# Patient Record
Sex: Male | Born: 1965 | Marital: Single | Smoking: Former smoker
Health system: Southern US, Community
[De-identification: ages and names within clinical notes are randomized; demographics above are authoritative.]

## PROBLEM LIST (undated history)

## (undated) DIAGNOSIS — T7840XA Allergy, unspecified, initial encounter: Secondary | ICD-10-CM

## (undated) DIAGNOSIS — J45909 Unspecified asthma, uncomplicated: Secondary | ICD-10-CM

## (undated) HISTORY — DX: Unspecified asthma, uncomplicated: J45.909

## (undated) HISTORY — DX: Allergy, unspecified, initial encounter: T78.40XA

---

## 2012-12-01 ENCOUNTER — Ambulatory Visit (INDEPENDENT_AMBULATORY_CARE_PROVIDER_SITE_OTHER): Payer: BC Managed Care – PPO | Admitting: Internal Medicine

## 2012-12-01 ENCOUNTER — Ambulatory Visit: Payer: BC Managed Care – PPO

## 2012-12-01 VITALS — BP 126/84 | HR 71 | Temp 97.9°F | Resp 16 | Ht 71.25 in | Wt 175.6 lb

## 2012-12-01 DIAGNOSIS — R059 Cough, unspecified: Secondary | ICD-10-CM

## 2012-12-01 DIAGNOSIS — R062 Wheezing: Secondary | ICD-10-CM

## 2012-12-01 DIAGNOSIS — R05 Cough: Secondary | ICD-10-CM

## 2012-12-01 MED ORDER — PREDNISONE 20 MG PO TABS: ORAL_TABLET | ORAL | Status: DC

## 2012-12-01 MED ORDER — LEVALBUTEROL HCL 0.63 MG/3ML IN NEBU
0.6300 mg | INHALATION_SOLUTION | Freq: Once | RESPIRATORY_TRACT | Status: AC
  Administered 2012-12-01: 0.63 mg via RESPIRATORY_TRACT

## 2012-12-01 MED ORDER — AZITHROMYCIN 250 MG PO TABS: ORAL_TABLET | ORAL | Status: DC

## 2012-12-01 NOTE — Progress Notes (Signed)
  Subjective:    Patient ID: Carl Mcmahon, male    DOB: 22-Aug-1966, 47 y.o.   MRN: [id number]  HPI 47 year old male presents with 1 week history of cough, nasal congestion, clear rhinorrhea, wheezing, and chest tightness.  He does have a history of asthma treated with daily Qvar and Advair and albuterol prn.  Says it is well controlled except will flare with illnesses and also during season changes.  He has recently moved here from [place] - states his allergies and asthma were both worse there.  Denies fever, chills, nausea, vomiting, headache, otalgia, sinus pain, or sore throat.  He is otherwise healthy with no other concerns today.      Review of Systems  Constitutional: Negative for fever and chills.  HENT: Positive for congestion and rhinorrhea. Negative for ear pain, sore throat, neck pain, neck stiffness and postnasal drip.   Respiratory: Positive for cough, shortness of breath and wheezing.   Cardiovascular: Negative for chest pain.  Gastrointestinal: Negative for nausea.  Neurological: Negative for dizziness and headaches.  All other systems reviewed and are negative.       Objective:   Physical Exam  Constitutional: He is oriented to person, place, and time. He appears well-developed and well-nourished.  HENT:  Head: Normocephalic and atraumatic.  Right Ear: Hearing, tympanic membrane, external ear and ear canal normal.  Left Ear: Hearing, tympanic membrane, external ear and ear canal normal.  Mouth/Throat: Uvula is midline, oropharynx is clear and moist and mucous membranes are normal. No oropharyngeal exudate.  Eyes: Conjunctivae are normal.  Neck: Normal range of motion. Neck supple.  Cardiovascular: Normal rate, regular rhythm and normal heart sounds.   Pulmonary/Chest: Effort normal. He has wheezes.  Lymphadenopathy:    He has no cervical adenopathy.  Neurological: He is alert and oriented to person, place, and time.  Psychiatric: He has a normal mood and affect. His  behavior is normal. Judgment and thought content normal.      [facility] reading (PRIMARY) by  Dr. Merla Riches as no acute infiltrate or consolidation noted.      Assessment & Plan:   1. Cough  DG Chest 2 View   DG Chest 2 View  2. Wheezing  levalbuterol (XOPENEX) nebulizer solution 0.63 mg   levalbuterol (XOPENEX) nebulizer solution 0.63 mg   Will go ahead and cover with Zpack Prednisone taper Recommend Ventolin q4-6hours prn wheezing and SOB Follow up if symptoms worsen or fail to improve.   I have reviewed and agree with documentation. Robert P. Merla Riches, M.D.

## 2013-02-27 ENCOUNTER — Ambulatory Visit (INDEPENDENT_AMBULATORY_CARE_PROVIDER_SITE_OTHER): Payer: BC Managed Care – PPO | Admitting: Family Medicine

## 2013-02-27 ENCOUNTER — Encounter: Payer: Self-pay | Admitting: Family Medicine

## 2013-02-27 VITALS — BP 118/82 | HR 80 | Temp 98.4°F | Resp 16 | Ht 70.0 in | Wt 177.2 lb

## 2013-02-27 DIAGNOSIS — J309 Allergic rhinitis, unspecified: Secondary | ICD-10-CM

## 2013-02-27 DIAGNOSIS — J45909 Unspecified asthma, uncomplicated: Secondary | ICD-10-CM | POA: Insufficient documentation

## 2013-02-27 MED ORDER — MOMETASONE FURO-FORMOTEROL FUM 200-5 MCG/ACT IN AERO: 2.0000 | INHALATION_SPRAY | Freq: Two times a day (BID) | RESPIRATORY_TRACT | Status: DC

## 2013-02-27 MED ORDER — ALBUTEROL SULFATE HFA 108 (90 BASE) MCG/ACT IN AERS: 2.0000 | INHALATION_SPRAY | Freq: Four times a day (QID) | RESPIRATORY_TRACT | Status: DC | PRN

## 2013-02-27 MED ORDER — TIOTROPIUM BROMIDE MONOHYDRATE 18 MCG IN CAPS: 18.0000 ug | ORAL_CAPSULE | Freq: Every day | RESPIRATORY_TRACT | Status: AC

## 2013-02-27 MED ORDER — MONTELUKAST SODIUM 10 MG PO TABS: 10.0000 mg | ORAL_TABLET | Freq: Every day | ORAL | Status: AC

## 2013-02-27 NOTE — Patient Instructions (Signed)
Asthma, Adult Asthma is caused by narrowing of the air passages in the lungs. It may be triggered by pollen, dust, animal dander, molds, some foods, respiratory infections, exposure to smoke, exercise, emotional stress or other allergens (things that cause allergic reactions or allergies). Repeat attacks are common. HOME CARE INSTRUCTIONS   Use prescription medications as ordered by your caregiver.  Avoid pollen, dust, animal dander, molds, smoke and other things that cause attacks at home and at work.  You may have fewer attacks if you decrease dust in your home. Electrostatic air cleaners may help.  It may help to replace your pillows or mattress with materials less likely to cause allergies.  Talk to your caregiver about an action plan for managing asthma attacks at home, including, the use of a peak flow meter which measures the severity of your asthma attack. An action plan can help minimize or stop the attack without having to seek medical care.  If you are not on a fluid restriction, drink 8 to 10 glasses of water each day.  Always have a plan prepared for seeking medical attention, including, calling your physician, accessing local emergency care, and calling 911 (in the [place] for a severe attack.  Discuss possible exercise routines with your caregiver.  If animal dander is the cause of asthma, you may need to get rid of pets. SEEK MEDICAL CARE IF:   You have wheezing and shortness of breath even if taking medicine to prevent attacks.  You have muscle aches, chest pain or thickening of sputum.  Your sputum changes from clear or white to yellow, green, gray, or bloody.  You have any problems that may be related to the medicine you are taking (such as a rash, itching, swelling or trouble breathing). SEEK IMMEDIATE MEDICAL CARE IF:   Your usual medicines do not stop your wheezing or there is increased coughing and/or shortness of breath.  You have increased difficulty  breathing.  You have a fever. MAKE SURE YOU:   Understand these instructions.  Will watch your condition.  Will get help right away if you are not doing well or get worse. Document Released: 10/09/2005 Document Revised: 01/01/2012 Document Reviewed: 05/27/2008 [facility] Patient Information 2013 [place], [place].   Asthma Attack Prevention HOW CAN ASTHMA BE PREVENTED? Currently, there is no way to prevent asthma from starting. However, you can take steps to control the disease and prevent its symptoms after you have been diagnosed. Learn about your asthma and how to control it. Take an active role to control your asthma by working with your caregiver to create and follow an asthma action plan. An asthma action plan guides you in taking your medicines properly, avoiding factors that make your asthma worse, tracking your level of asthma control, responding to worsening asthma, and seeking emergency care when needed. To track your asthma, keep records of your symptoms, check your peak flow number using a peak flow meter (handheld device that shows how well air moves out of your lungs), and get regular asthma checkups.  Other ways to prevent asthma attacks include:  Use medicines as your caregiver directs.  Identify and avoid things that make your asthma worse (as much as you can).  Keep track of your asthma symptoms and level of control.  Get regular checkups for your asthma.  With your caregiver, write a detailed plan for taking medicines and managing an asthma attack. Then be sure to follow your action plan. Asthma is an ongoing condition that needs regular monitoring  and treatment.  Identify and avoid asthma triggers. A number of outdoor allergens and irritants (pollen, mold, cold air, air pollution) can trigger asthma attacks. Find out what causes or makes your asthma worse, and take steps to avoid those triggers (see below).  Monitor your breathing. Learn to recognize warning signs of  an attack, such as slight coughing, wheezing or shortness of breath. However, your lung function may already decrease before you notice any signs or symptoms, so regularly measure and record your peak airflow with a home peak flow meter.  Identify and treat attacks early. If you act quickly, you're less likely to have a severe attack. You will also need less medicine to control your symptoms. When your peak flow measurements decrease and alert you to an upcoming attack, take your medicine as instructed, and immediately stop any activity that may have triggered the attack. If your symptoms do not improve, get medical help.  Pay attention to increasing quick-relief inhaler use. If you find yourself relying on your quick-relief inhaler (such as albuterol), your asthma is not under control. See your caregiver about adjusting your treatment. IDENTIFY AND CONTROL FACTORS THAT MAKE YOUR ASTHMA WORSE A number of common things can set off or make your asthma symptoms worse (asthma triggers). Keep track of your asthma symptoms for several weeks, detailing all the environmental and emotional factors that are linked with your asthma. When you have an asthma attack, go back to your asthma diary to see which factor, or combination of factors, might have contributed to it. Once you know what these factors are, you can take steps to control many of them.  Allergies: If you have allergies and asthma, it is important to take asthma prevention steps at home. Asthma attacks (worsening of asthma symptoms) can be triggered by allergies, which can cause temporary increased inflammation of your airways. Minimizing contact with the substance to which you are allergic will help prevent an asthma attack. Animal Dander:   Some people are allergic to the flakes of skin or dried saliva from animals with fur or feathers. Keep these pets out of your home.  If you can't keep a pet outdoors, keep the pet out of your bedroom and other  sleeping areas at all times, and keep the door closed.  Remove carpets and furniture covered with cloth from your home. If that is not possible, keep the pet away from fabric-covered furniture and carpets. Dust Mites:  Many people with asthma are allergic to dust mites. Dust mites are tiny bugs that are found in every home, in mattresses, pillows, carpets, fabric-covered furniture, bedcovers, clothes, stuffed toys, fabric, and other fabric-covered items.  Cover your mattress in a special dust-proof cover.  Cover your pillow in a special dust-proof cover, or wash the pillow each week in hot water. Water must be hotter than 130 F to kill dust mites. Cold or warm water used with detergent and bleach can also be effective.  Wash the sheets and blankets on your bed each week in hot water.  Try not to sleep or lie on cloth-covered cushions.  Call ahead when traveling and ask for a smoke-free hotel room. Bring your own bedding and pillows, in case the hotel only supplies feather pillows and down comforters, which may contain dust mites and cause asthma symptoms.  Remove carpets from your bedroom and those laid on concrete, if you can.  Keep stuffed toys out of the bed, or wash the toys weekly in hot water or cooler water  with detergent and bleach. Cockroaches:  Many people with asthma are allergic to the droppings and remains of cockroaches.  Keep food and garbage in closed containers. Never leave food out.  Use poison baits, traps, powders, gels, or paste (for example, boric acid).  If a spray is used to kill cockroaches, stay out of the room until the odor goes away. Indoor Mold:  Fix leaky faucets, pipes, or other sources of water that have mold around them.  Clean moldy surfaces with a cleaner that has bleach in it. Pollen and Outdoor Mold:  When pollen or mold spore counts are high, try to keep your windows closed.  Stay indoors with windows closed from late morning to afternoon,  if you can. Pollen and some mold spore counts are highest at that time.  Ask your caregiver whether you need to take or increase anti-inflammatory medicine before your allergy season starts. Irritants:   Tobacco smoke is an irritant. If you smoke, ask your caregiver how you can quit. Ask family members to quit smoking, too. Do not allow smoking in your home or car.  If possible, do not use a wood-burning stove, kerosene heater, or fireplace. Minimize exposure to all sources of smoke, including incense, candles, fires, and fireworks.  Try to stay away from strong odors and sprays, such as perfume, talcum powder, hair spray, and paints.  Decrease humidity in your home and use an indoor air cleaning device. Reduce indoor humidity to below 60 percent. Dehumidifiers or central air conditioners can do this.  Try to have someone else vacuum for you once or twice a week, if you can. Stay out of rooms while they are being vacuumed and for a short while afterward.  If you vacuum, use a dust mask from a hardware store, a double-layered or microfilter vacuum cleaner bag, or a vacuum cleaner with a HEPA filter.  Sulfites in foods and beverages can be irritants. Do not drink beer or wine, or eat dried fruit, processed potatoes, or shrimp if they cause asthma symptoms.  Cold air can trigger an asthma attack. Cover your nose and mouth with a scarf on cold or windy days.  Several health conditions can make asthma more difficult to manage, including runny nose, sinus infections, reflux disease, psychological stress, and sleep apnea. Your caregiver will treat these conditions, as well.  Avoid close contact with people who have a cold or the flu, since your asthma symptoms may get worse if you catch the infection from them. Wash your hands thoroughly after touching items that may have been handled by people with a respiratory infection.  Get a flu shot every year to protect against the flu virus, which often  makes asthma worse for days or weeks. Also get a pneumonia shot once every five to 10 years. Drugs:  Aspirin and other painkillers can cause asthma attacks. 10% to 20% of people with asthma have sensitivity to aspirin or a group of painkillers called non-steroidal anti-inflammatory drugs (NSAIDS), such as ibuprofen and naproxen. These drugs are used to treat pain and reduce fevers. Asthma attacks caused by any of these medicines can be severe and even fatal. These drugs must be avoided in people who have known aspirin sensitive asthma. Products with acetaminophen are considered safe for people who have asthma. It is important that people with aspirin sensitivity read labels of all over-the-counter drugs used to treat pain, colds, coughs, and fever.  Beta blockers and ACE inhibitors are other drugs which you should discuss with  your caregiver, in relation to your asthma. ALLERGY SKIN TESTING  Ask your asthma caregiver about allergy skin testing or blood testing (RAST test) to identify the allergens to which you are sensitive. If you are found to have allergies, allergy shots (immunotherapy) for asthma may help prevent future allergies and asthma. With allergy shots, small doses of allergens (substances to which you are allergic) are injected under your skin on a regular schedule. Over a period of time, your body may become used to the allergen and less responsive with asthma symptoms. You can also take measures to minimize your exposure to those allergens. EXERCISE  If you have exercise-induced asthma, or are planning vigorous exercise, or exercise in cold, humid, or dry environments, prevent exercise-induced asthma by following your caregiver's advice regarding asthma treatment before exercising. Document Released: 09/27/2009 Document Revised: 01/01/2012 Document Reviewed: 09/27/2009 [facility] Patient Information 2013 [place], [place].   You may find using a steroid nasal spray is helpful for reducing  allergy symptoms; Nasacort is over-the-counter. Spray gently 1 time in each nostril at bedtime; it may take 2 weeks for you  To notice if this medication is helping.

## 2013-02-27 NOTE — Progress Notes (Signed)
S:  This 48 y.o. Cauc male is originally from [place]; he moved to the [place]  ([place], [place]) a few years ago. He came to [facility] in Jan 2014; he is an Art gallery manager. He has hx of Asthma since childhood; allergy symptoms have been a problem,  worse while living in [place] but have been exacerbated by allergy season in last few weeks.  Asthma symptoms were controlled on QVAR and Advair plus occasional use on Spiriva capsules; these were prescribed by physician in [place]. Pt  recalls using Singulair years ago but can not comment on effectiveness. He does report that Advair causes mild tremulousness.  Pt has no pets. His work environment does not aggravate his medical problems.  PMHx, Soc Hx and Fam Hx reviewed.   ROS: As per HPI; AR symptoms include sore scratchy throat, PND w/ mild rhinorrhea and sneezing w/ itchy watery eyes. Negative for fatigue, sinus pressure or HA, hoarseness, hearing loss, tinnitus, epistaxis, vision changes, wheezing, nocturnal cough, snoring, dizziness, or weakness.  O: Filed Vitals:   02/27/13 0856  BP: 118/82  Pulse: 80  Temp: 98.4 F (36.9 C)  TempSrc: Oral  Resp: 16  Height: 5\' 10"  (1.778 m)  Weight: 177 lb 3.2 oz (80.377 kg)  SpO2: 95%   GEN: In NAD; WN,WD. HEENT: [place]/AT; Eyelids normal w/ EOMI and clear conj/ sclerae. EACs/ TMs normal. Nasal mucosa w/o edema or lesions. Post ph erythematous w/ cobblestoning, no lesions. NT sinuses. NECK: Supple w/o  LAN or TMG. COR: RRR. No m/g/r. Pulses normal. LUNGS: CTA; No wheezes or rhonchi. Decreased BS at bases but sub-optimal effort. SKIN: W&D; no lesions, erythema or pallor. MS: MAEs; no c/c/e. No deformities. NEURO: A&O x 3; CNs intact. Nonfocal.  A/P: Intrinsic asthma - Childhood onset; pt stable on Steroid MDI + combo Steroid/LABA MDI; advised pt that this was not standard treatment and that I wanted his management going forward to be directed by specialist. He agreed.                                 RX:  Dulera 200-5 mcg (generic) 2 puffs bid; rinse mouth after use. Discontinue QVAR.  Trial: MonteluKast 10 mg 1 tab at bedtime; pt wants to wait to start this medication in 2 weeks after assessing Dulera.                                He wants to continue using Spiriva prn.  Advise OTC NAsacort NS for A.R. symptoms.                                Plan: Ambulatory referral to Allergy/Asthma.  Allergic rhinitis, cause unspecified -  As above.  Plan: Ambulatory referral to Allergy/Asthma Specialist.

## 2013-09-03 ENCOUNTER — Encounter: Payer: BC Managed Care – PPO | Admitting: Family Medicine

## 2013-11-05 ENCOUNTER — Telehealth: Payer: Self-pay

## 2013-11-05 NOTE — Telephone Encounter (Signed)
Pt was referred by us to a pulmonary doctor.  They have prescribed him xolair which has to be given by injection.  He will be coming tomorrow for an appointment to have paperwork filled out about this medication and to see how we would go about handling these thereafter.  He wants to make sure we can do this before he comes for his appointment.  Please call  [phone]

## 2013-11-06 ENCOUNTER — Ambulatory Visit: Payer: BC Managed Care – PPO | Admitting: Family Medicine

## 2014-01-31 IMAGING — CR DG CHEST 2V
2 series · 2 of 2 positions shown · non-contrast
Comparison: None.

CLINICAL DATA: Cough

CHEST - 2 VIEW

[PA]
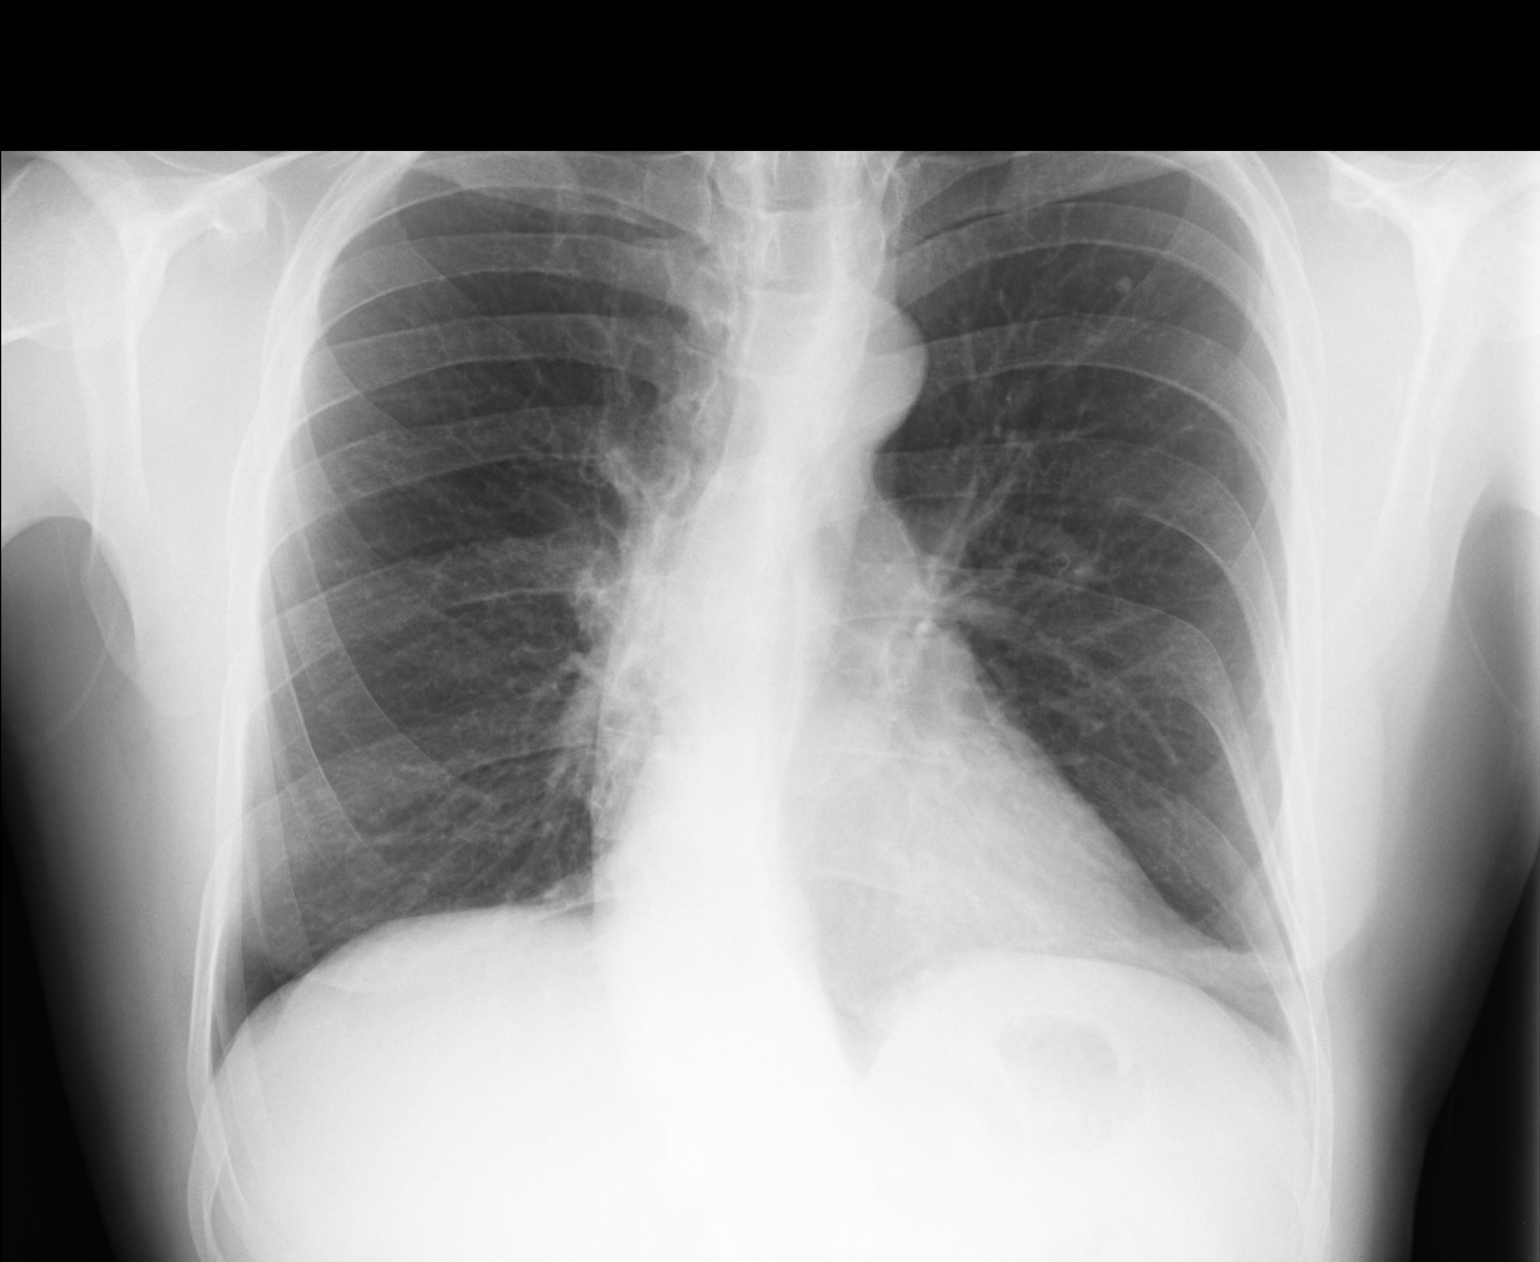

[lateral]
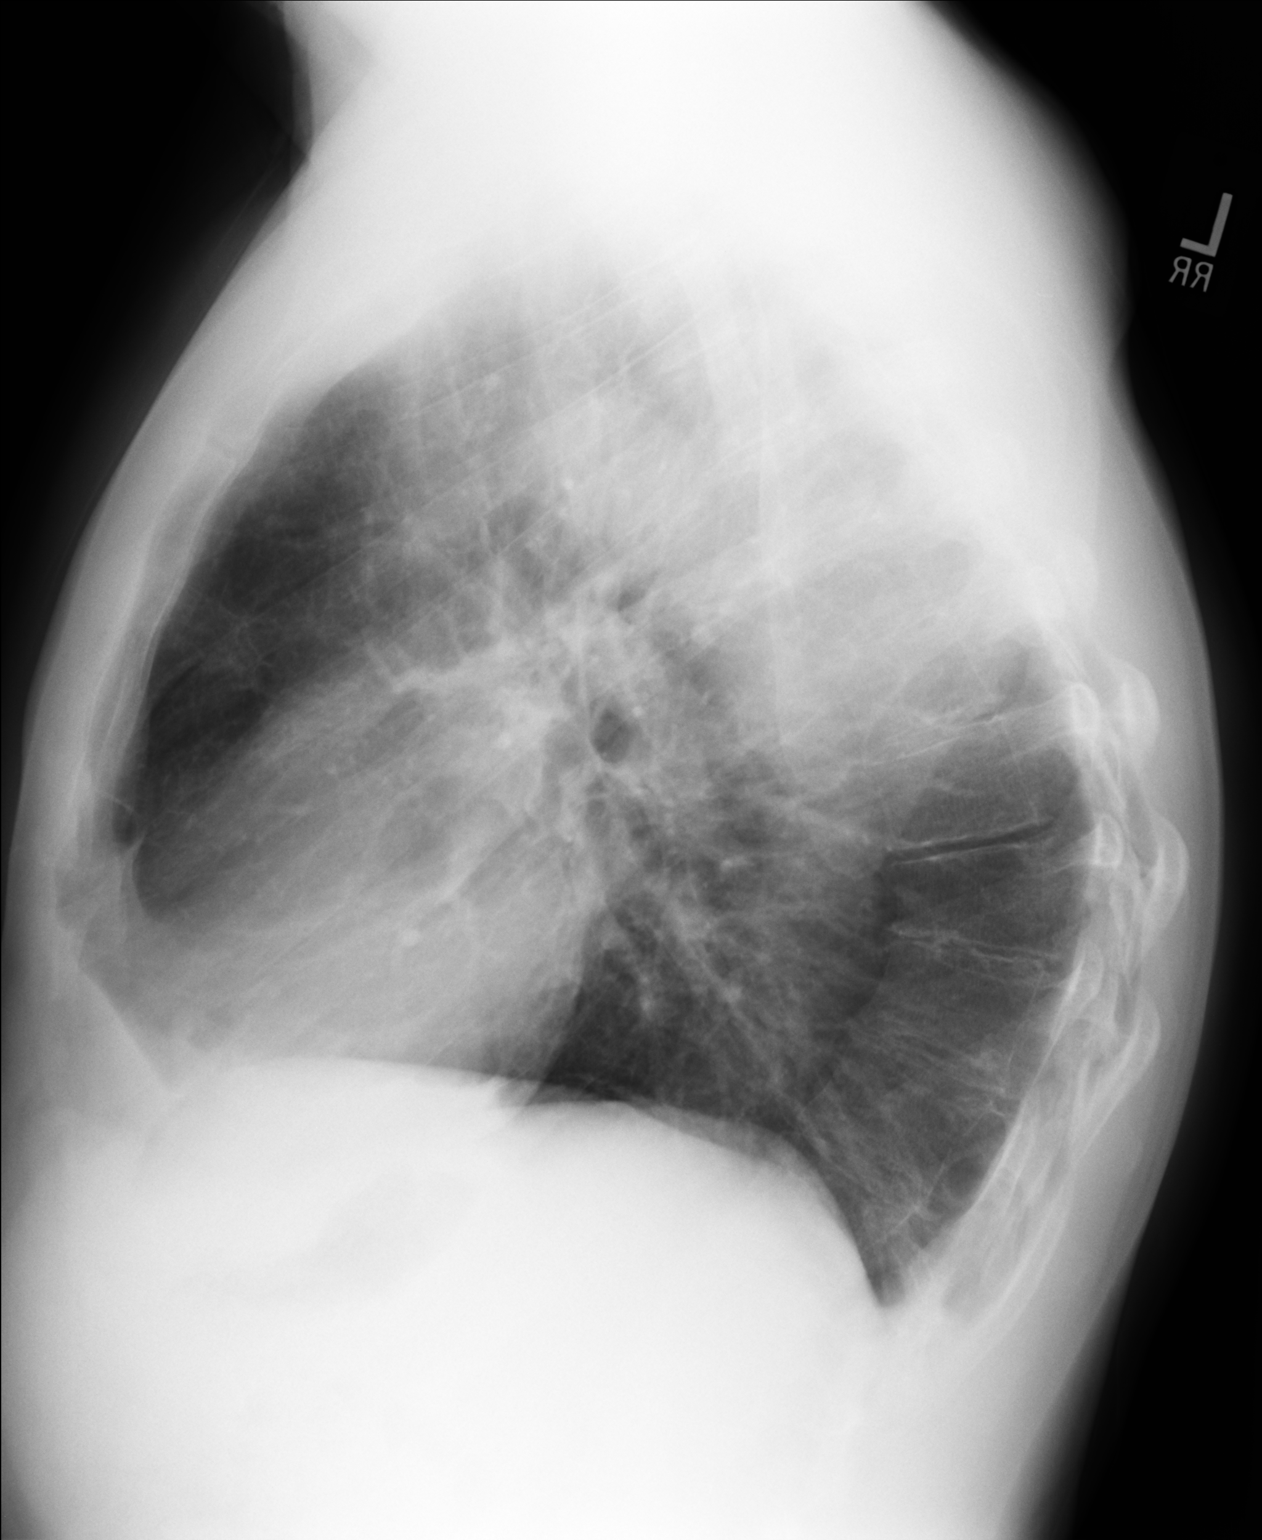

[2 of 2 positions shown; findings below may reference images not displayed]

FINDINGS: The heart and pulmonary vascularity are within normal
limits.  The lungs are clear bilaterally.  Mild scoliosis concave
to the left is seen in the lower thoracic spine.
IMPRESSION: No acute abnormality noted.

## 2014-04-14 ENCOUNTER — Ambulatory Visit (INDEPENDENT_AMBULATORY_CARE_PROVIDER_SITE_OTHER): Payer: BC Managed Care – PPO | Admitting: Family Medicine

## 2014-04-14 VITALS — BP 138/80 | HR 95 | Temp 98.1°F | Resp 16 | Ht 69.5 in | Wt 177.4 lb

## 2014-04-14 DIAGNOSIS — R300 Dysuria: Secondary | ICD-10-CM

## 2014-04-14 MED ORDER — AZITHROMYCIN 250 MG PO TABS: ORAL_TABLET | ORAL | Status: DC

## 2014-04-14 NOTE — Progress Notes (Signed)
° °  Subjective:    Patient ID: Carl Mcmahon, male    DOB: 12/23/1965, 48 y.o.   MRN: [id number] This chart was scribed for Elvina SidleKurt Lauenstein, MD by Evon Slackerrance Branch, ED Scribe. This Patient was seen in room 03 and the patients care was started at 8:39 PM  HPI HPI Comments: Carl KocherBrian Mcmahon is a 48 y.o. [place] male who presents to the [facility] complaining of reoccurring UTI onset 2 days. He states that he has had this infection before with similar symptoms. He states he commonly gets the symptoms from having unprotected sex with his partner on her menstrual cycle. He states he has associated penile discharge and dysuria. He states he has a history of COPD. He states his PCP Dr.Mcpherson. 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     Objective:    Physical Exam  Patient in no acute distress HEENT unremarkable Patient referred not to disrobe because he's had this problem before.    Assessment & Plan:   Dysuria - Plan: azithromycin (ZITHROMAX) 250 MG tablet  Signed, Elvina SidleKurt Lauenstein, MD

## 2014-07-29 ENCOUNTER — Ambulatory Visit (INDEPENDENT_AMBULATORY_CARE_PROVIDER_SITE_OTHER): Payer: BC Managed Care – PPO | Admitting: Family Medicine

## 2014-07-29 VITALS — BP 130/82 | HR 70 | Temp 98.0°F | Resp 20 | Ht 70.0 in | Wt 181.4 lb

## 2014-07-29 DIAGNOSIS — S41152A Open bite of left upper arm, initial encounter: Secondary | ICD-10-CM

## 2014-07-29 DIAGNOSIS — W540XXA Bitten by dog, initial encounter: Secondary | ICD-10-CM

## 2014-07-29 MED ORDER — AMOXICILLIN-POT CLAVULANATE 875-125 MG PO TABS: 1.0000 | ORAL_TABLET | Freq: Two times a day (BID) | ORAL | Status: DC

## 2014-07-29 NOTE — Progress Notes (Signed)
  Carl KocherBrian Boback - 48 y.o. male MRN [id number]  Date of birth: 11/29/1965  SUBJECTIVE:  Including CC & ROS.  patient C/O: dog bite  Onset of symptoms: [place] Symptoms: mild abrasion, no surrounding redness, no puncture, no bleeding, no pus, no drainage, no lymph node swelling Relieving factors:  Nothing taken or applied topically  Patient reports recent tetanus vaccination in the last 5 years No history of rabies treatment   ROS:  Constitutional:  No fever, chills, or fatigue.  Respiratory:  No shortness of breath, cough, or wheezing Cardiovascular:  No palpitations, chest pain or syncope Gastrointestinal:  No nausea, no abdominal pain Review of systems otherwise negative except for what is stated in HPI  HISTORY: Past Medical, Surgical, Social, and Family History Reviewed & Updated per EMR. Pertinent Historical Findings include: former smoker, history asthma  PHYSICAL EXAM:  VS: BP:130/82 mmHg  HR:70bpm  TEMP:98 F (36.7 C)(Oral)  RESP:96 %  HT:5\' 10"  (177.8 cm)   WT:181 lb 6.4 oz (82.283 kg)  BMI:26.1 PHYSICAL EXAM: General:  Alert and oriented, No acute distress.   HENT:  Normocephalic, Oral mucosa is moist.   Respiratory:   Respirations are non-labored, Symmetrical chest wall expansion.   Cardiovascular:   No edema.   Integumentary:  Warm, Dry, No rash. Left forearm has a 3 cm small skin abrasion with no signs of puncture, bleeding, blood, pus or surrounding erythema from a reported dog bite  Neurologic:  Alert, Oriented, No focal defects Psychiatric:  Cooperative, Appropriate mood & affect.    ASSESSMENT & PLAN:  Soft tissue injury from dog bite with no puncture  Recommendation: -Given the proximity to the hand recommend treating prophylactically with antibiotic therapy. Patient was placed on Augmentin twice a day dosing for the next 7 days - patient is up to date with tetanus -Advised patient as far as rabies treatment this will be based on his knowledge  of the dogs potential of being vaccinated if he is concerned that the child has not been vaccinated properly he can proceed to receive rabies treatment in the emergency room

## 2014-07-30 ENCOUNTER — Telehealth: Payer: Self-pay

## 2014-07-30 ENCOUNTER — Ambulatory Visit: Payer: BC Managed Care – PPO | Admitting: Family Medicine

## 2014-07-30 NOTE — Telephone Encounter (Signed)
Dr. Neva SeatGreene, did you call pt last night?

## 2014-07-30 NOTE — Telephone Encounter (Signed)
Patient is requesting a nurse to call him to advise him of the message a doctor left last night.    [phone]

## 2014-07-30 NOTE — Telephone Encounter (Signed)
Patient called back requesting a provider to call him to explain a message left last night night. Patients call back number is [phone]

## 2014-07-31 NOTE — Telephone Encounter (Signed)
I believe Dr. Tammy Soursidiano called him about rabies vaccination and options if needed through the emergency room, but this can also be discussed with animal control as unknown status of dog that bit him.

## 2014-08-01 NOTE — Telephone Encounter (Signed)
Carl Mcmahon, pt has a ton of questions about rabies immunoglobins and whether or not he should receive it. He requests a "doctor" give him a call. Can you give him a call sometime at your convenience please. Thanks

## 2014-08-02 NOTE — Telephone Encounter (Signed)
I will send to Dr Tammy Soursidiano for her to call the patient to answer his questions.  Please call and let the patient know that she should be contacting him.

## 2014-08-03 ENCOUNTER — Encounter (HOSPITAL_COMMUNITY): Payer: Self-pay | Admitting: Emergency Medicine

## 2014-08-03 ENCOUNTER — Emergency Department (HOSPITAL_COMMUNITY)
Admission: EM | Admit: 2014-08-03 | Discharge: 2014-08-03 | Disposition: A | Discharge status: Home / Self Care | Payer: BC Managed Care – PPO | Attending: Emergency Medicine | Admitting: Emergency Medicine

## 2014-08-03 DIAGNOSIS — Z7951 Long term (current) use of inhaled steroids: Secondary | ICD-10-CM | POA: Diagnosis not present

## 2014-08-03 DIAGNOSIS — J45909 Unspecified asthma, uncomplicated: Secondary | ICD-10-CM | POA: Diagnosis not present

## 2014-08-03 DIAGNOSIS — Z79899 Other long term (current) drug therapy: Secondary | ICD-10-CM | POA: Insufficient documentation

## 2014-08-03 DIAGNOSIS — Z87891 Personal history of nicotine dependence: Secondary | ICD-10-CM | POA: Insufficient documentation

## 2014-08-03 DIAGNOSIS — Y929 Unspecified place or not applicable: Secondary | ICD-10-CM | POA: Diagnosis not present

## 2014-08-03 DIAGNOSIS — Y939 Activity, unspecified: Secondary | ICD-10-CM | POA: Diagnosis not present

## 2014-08-03 DIAGNOSIS — S51812A Laceration without foreign body of left forearm, initial encounter: Secondary | ICD-10-CM | POA: Insufficient documentation

## 2014-08-03 DIAGNOSIS — W540XXA Bitten by dog, initial encounter: Secondary | ICD-10-CM | POA: Insufficient documentation

## 2014-08-03 NOTE — ED Notes (Addendum)
Pt was bitten by dog on Oct 7 on left wrist. Pt not sure if dog is up to date with shots. Site is WDL, two small reddened marks to left wrist.

## 2014-08-03 NOTE — Discharge Instructions (Signed)
Call animal control for further instruction regarding getting the dog quarantined, or return to the ER for vaccines if unable to get the dog located. If no symptoms develop in 5 days, you will most likely not need anything further. Keep your wound clean and dry and watch for signs of infection. Return to the ER for any changes or worsening symptoms.   Animal Bite An animal bite can result in a scratch on the skin, deep open cut, puncture of the skin, crush injury, or tearing away of the skin or a body part. Dogs are responsible for most animal bites. Children are bitten more often than adults. An animal bite can range from very mild to more serious. A small bite from your house pet is no cause for alarm. However, some animal bites can become infected or injure a bone or other tissue. You must seek medical care if:  The skin is broken and bleeding does not slow down or stop after 15 minutes.  The puncture is deep and difficult to clean (such as a cat bite).  Pain, warmth, redness, or pus develops around the wound.  The bite is from a stray animal or rodent. There may be a risk of rabies infection.  The bite is from a snake, raccoon, skunk, fox, coyote, or bat. There may be a risk of rabies infection.  The person bitten has a chronic illness such as diabetes, liver disease, or cancer, or the person takes medicine that lowers the immune system.  There is concern about the location and severity of the bite. It is important to clean and protect an animal bite wound right away to prevent infection. Follow these steps:  Clean the wound with plenty of water and soap.  Apply an antibiotic cream.  Apply gentle pressure over the wound with a clean towel or gauze to slow or stop bleeding.  Elevate the affected area above the heart to help stop any bleeding.  Seek medical care. Getting medical care within 8 hours of the animal bite leads to the best possible outcome. DIAGNOSIS  Your caregiver will  most likely:  Take a detailed history of the animal and the bite injury.  Perform a wound exam.  Take your medical history. Blood tests or X-rays may be performed. Sometimes, infected bite wounds are cultured and sent to a lab to identify the infectious bacteria.  TREATMENT  Medical treatment will depend on the location and type of animal bite as well as the patient's medical history. Treatment may include:  Wound care, such as cleaning and flushing the wound with saline solution, bandaging, and elevating the affected area.  Antibiotics.  Tetanus immunization.  Rabies immunization.  Leaving the wound open to heal. This is often done with animal bites, due to the high risk of infection. However, in certain cases, wound closure with stitches, wound adhesive, skin adhesive strips, or staples may be used. Infected bites that are left untreated may require intravenous (IV) antibiotics and surgical treatment in the hospital. HOME CARE INSTRUCTIONS  Follow your caregiver's instructions for wound care.  Take all medicines as directed.  If your caregiver prescribes antibiotics, take them as directed. Finish them even if you start to feel better.  Follow up with your caregiver for further exams or immunizations as directed. You may need a tetanus shot if:  You cannot remember when you had your last tetanus shot.  You have never had a tetanus shot.  The injury broke your skin. If you get a  tetanus shot, your arm may swell, get red, and feel warm to the touch. This is common and not a problem. If you need a tetanus shot and you choose not to have one, there is a rare chance of getting tetanus. Sickness from tetanus can be serious. SEEK MEDICAL CARE IF:  You notice warmth, redness, soreness, swelling, pus discharge, or a bad smell coming from the wound.  You have a red line on the skin coming from the wound.  You have a fever, chills, or a general ill feeling.  You have nausea or  vomiting.  You have continued or worsening pain.  You have trouble moving the injured part.  You have other questions or concerns. MAKE SURE YOU:  Understand these instructions.  Will watch your condition.  Will get help right away if you are not doing well or get worse. Document Released: 06/27/2011 Document Revised: 01/01/2012 Document Reviewed: 06/27/2011 [facility] Patient Information 2015 [place], [place]. This information is not intended to replace advice given to you by your health care provider. Make sure you discuss any questions you have with your health care provider.

## 2014-08-03 NOTE — ED Provider Notes (Signed)
CSN: [id number]     Arrival date & time 08/03/14  1017 History   First MD Initiated Contact with Patient 08/03/14 1034     Chief Complaint  Patient presents with  . Animal Bite     (Consider location/radiation/quality/duration/timing/severity/associated sxs/prior Treatment) HPI Comments: Pat KocherBrian Kalama is a 48 y.o. male with a PMHx of asthma and allergies, who presents to the ED with complaints of a dog bite from 5 days ago. Patient states he went to an urgent care who stated that he would be fine and indicated how to take care of the wound and gave him augmentin for the bite wound. Several days later they contacted him regarding the animals vaccination history and his possible need for post exposure prophylaxis, indicating that he would need to come to the emergency department for these vaccinations. He did not instructed to contact animal control and he is not sure if they contacted animal control himself. Patient states that the dog was owned by an older who did not appear much financial means, but he did not inquire regarding the dog's vaccination history. He does note that the dog was owned, and believes he knows the residents at which they live. He states he presents today for possible vaccination. He has not had any symptoms of rabies, but has not had the dog quarantined over the last 5 days. Denies any complaints at this time. Wound is healing well without any erythema, warmth, or cellulitic areas. No weeping or drainage.   Patient is a 48 y.o. male presenting with animal bite. The history is provided by the patient. No language interpreter was used.  Animal Bite Contact animal:  Dog Location:  Shoulder/arm Shoulder/arm injury location:  L forearm Time since incident:  5 days Pain details:    Severity:  No pain   Progression:  Resolved Incident location:  Outside Provoked: unprovoked   Notifications:  None Animal's rabies vaccination status:  Unknown Animal in possession: no    Tetanus status:  Up to date Relieved by:  OTC medications Worsened by:  Nothing tried Ineffective treatments:  None tried Associated symptoms: no fever, no numbness and no swelling     Past Medical History  Diagnosis Date  . Asthma   . Allergy    History reviewed. No pertinent past surgical history. No family history on file. History  Substance Use Topics  . Smoking status: Former Games developermoker  . Smokeless tobacco: Never Used  . Alcohol Use: 0.0 oz/week    1-2 Cans of beer per week    Review of Systems  Constitutional: Negative for fever and fatigue.  HENT: Negative for drooling.   Respiratory: Negative for shortness of breath.   Cardiovascular: Negative for chest pain.  Gastrointestinal: Negative for nausea, vomiting and abdominal pain.  Skin: Positive for wound. Negative for color change.  Neurological: Negative for dizziness, tremors, syncope, weakness, light-headedness, numbness and headaches.  Psychiatric/Behavioral: Negative for confusion and agitation.   10 Systems reviewed and are negative for acute change except as noted in the HPI.    Allergies  Review of patient's allergies indicates no known allergies.  Home Medications   Prior to Admission medications   Medication Sig Start Date End Date Taking? Authorizing Provider  amoxicillin-clavulanate (AUGMENTIN) 875-125 MG per tablet Take 1 tablet by mouth 2 (two) times daily. 07/29/14   Deanna M Didiano, DO  beclomethasone (QVAR) 80 MCG/ACT inhaler Inhale 2 puffs into the lungs 2 (two) times daily.    Historical Provider, MD  budesonide (  PULMICORT) 180 MCG/ACT inhaler Inhale 4 puffs into the lungs 2 (two) times daily.    Historical Provider, MD  formoterol (FORADIL) 12 MCG capsule for inhaler Place 12 mcg into inhaler and inhale every 12 (twelve) hours.    Historical Provider, MD  ipratropium (ATROVENT HFA) 17 MCG/ACT inhaler Inhale 2 puffs into the lungs as needed for wheezing.    Historical Provider, MD  montelukast  (SINGULAIR) 10 MG tablet Take 1 tablet (10 mg total) by mouth at bedtime. 02/27/13   Maurice MarchBarbara B McPherson, MD  Omalizumab Geoffry Paradise(XOLAIR [place]) Inject into the skin. two times a month    Historical Provider, MD  tiotropium (SPIRIVA) 18 MCG inhalation capsule Place 1 capsule (18 mcg total) into inhaler and inhale daily. 02/27/13   Maurice MarchBarbara B McPherson, MD   BP 160/92  Pulse 69  Resp 18  SpO2 95% Physical Exam  Nursing note and vitals reviewed. Constitutional: He is oriented to person, place, and time. Vital signs are normal. He appears well-developed and well-nourished. No distress.  Afebrile, nontoxic, NAD  HENT:  Head: Normocephalic and atraumatic.  Mouth/Throat: Mucous membranes are normal.  Eyes: Conjunctivae and EOM are normal. Right eye exhibits no discharge. Left eye exhibits no discharge.  Neck: Normal range of motion. Neck supple.  Cardiovascular: Normal rate and intact distal pulses.   Pulmonary/Chest: Effort normal. No respiratory distress.  Abdominal: Normal appearance. He exhibits no distension.  Musculoskeletal: Normal range of motion.  Neurological: He is alert and oriented to person, place, and time.  Skin: Skin is warm and dry. Abrasion noted. No rash noted.  Small bite mark on L forearm, medially, with healing bruise. No weepage or erythema, no warmth to the area, no surrounding cellulitis  Psychiatric: He has a normal mood and affect. His speech is normal and behavior is normal. Cognition and memory are normal.  Mood, affect, behavior, and memory all WNL    ED Course  Procedures (including critical care time) Labs Review Labs Reviewed - No data to display  Imaging Review No results found.   EKG Interpretation None      MDM   Final diagnoses:  Dog bite    48y/o male with dog bite from 5 days ago. Dog unknown vaccine hx, but can be located. Has not contacted animal control. Told to come here from [facility], went there originally and did not start him on any vaccines and didn't  mention his need for vaccines, called back and told him to come here. Pt with improved bite mark, no s/sx of cellulitis. No neuro deficits or AMS, no s/sx of rabies at this time. Discussed that incubation period is up to 10 days, therefore would need to track down dog and quarantine it, vs come back for vaccines/prophylaxis. Pt agrees with this plan. Tetanus UTD, no need to give it today. Given strict return precautions.   BP 160/92  Pulse 69  Resp 18  SpO2 95%      Donnita FallsMercedes Strupp Camprubi-Soms, PA-C 08/03/14 1144

## 2014-08-03 NOTE — ED Provider Notes (Signed)
Medical screening examination/treatment/procedure(s) were performed by non-physician practitioner and as supervising physician I was immediately available for consultation/collaboration.   EKG Interpretation None        Lu Paradise M Mychael Smock, MD 08/03/14 1535 

## 2014-08-03 NOTE — Telephone Encounter (Signed)
Left a message for the patient telling him that concerning treatment for rabies he can receive treatment at the emergency room if his is concerned that the dog who bite him was unvaccinated or he is unsure of his vaccination history.

## 2015-06-09 ENCOUNTER — Ambulatory Visit (INDEPENDENT_AMBULATORY_CARE_PROVIDER_SITE_OTHER): Payer: 59 | Admitting: Family Medicine

## 2015-06-09 VITALS — BP 130/78 | HR 76 | Temp 98.1°F | Resp 16 | Ht 71.0 in | Wt 186.0 lb

## 2015-06-09 DIAGNOSIS — H0014 Chalazion left upper eyelid: Secondary | ICD-10-CM | POA: Diagnosis not present

## 2015-06-09 NOTE — Patient Instructions (Signed)
We will refer you to the ophthalmologist.  Okay to continue warm compresses to this area as this can sometimes help with the resolution on its own. This does not look infected now, so I do not thinks it needs an antibiotic at this point. If you notice increased swelling, redness, or spread of the redness, return so I can determine if we do need to put you on some antibiotics at that time.  Return to the clinic or go to the nearest emergency room if any of your symptoms worsen or new symptoms occur.   Chalazion A chalazion is a swelling or hard lump on the eyelid caused by a blocked oil gland. Chalazions may occur on the upper or the lower eyelid.  CAUSES  Oil gland in the eyelid becomes blocked. SYMPTOMS   Swelling or hard lump on the eyelid. This lump may make it hard to see out of the eye.  The swelling may spread to areas around the eye. TREATMENT   Although some chalazions disappear by themselves in 1 or 2 months, some chalazions may need to be removed.  Medicines to treat an infection may be required. HOME CARE INSTRUCTIONS   Wash your hands often and dry them with a clean towel. Do not touch the chalazion.  Apply heat to the eyelid several times a day for 10 minutes to help ease discomfort and bring any yellowish white fluid (pus) to the surface. One way to apply heat to a chalazion is to use the handle of a metal spoon.  Hold the handle under hot water until it is hot, and then wrap the handle in paper towels so that the heat can come through without burning your skin.  Hold the wrapped handle against the chalazion and reheat the spoon handle as needed.  Apply heat in this fashion for 10 minutes, 4 times per day.  Return to your caregiver to have the pus removed if it does not break (rupture) on its own.  Do not try to remove the pus yourself by squeezing the chalazion or sticking it with a pin or needle.  Only take over-the-counter or prescription medicines for pain,  discomfort, or fever as directed by your caregiver. SEEK IMMEDIATE MEDICAL CARE IF:   You have pain in your eye.  Your vision changes.  The chalazion does not go away.  The chalazion becomes painful, red, or swollen, grows larger, or does not start to disappear after 2 weeks. MAKE SURE YOU:   Understand these instructions.  Will watch your condition.  Will get help right away if you are not doing well or get worse. Document Released: 10/06/2000 Document Revised: 01/01/2012 Document Reviewed: 01/24/2010 [facility] Patient Information 2015 [place], [place]. This information is not intended to replace advice given to you by your health care provider. Make sure you discuss any questions you have with your health care provider.

## 2015-06-09 NOTE — Progress Notes (Signed)
Subjective:  This chart was scribed for Carl Staggers, MD by Andrew Au, ED Scribe. This patient was seen in room 13 and the patient's care was started at 8:33 PM.  Patient ID: Carl Mcmahon, male    DOB: [birth year], 49 y.o.   MRN: [id number]  HPI   Chief Complaint  Patient presents with  . Eye Problem    stye left eye   HPI Comments: Carl Mcmahon is a 49 y.o. male who presents to the [facility] complaining of a erythematous, bump to left upper eye lid noticed 6 weeks ago. Pt states  bump to left upper eye lid will get better and then return a couple days later. States last week the bump was at its largest. He notes drainage from bump 1 week ago. Pt has tried refreshed eye drops as well as hydrocortisone cream. He denies being painful or itchy. He denies visual changes. He denies CP, SOB, and cough. Pt wears glass and is followed by an optician.   Pt is from [place].  Patient Active Problem List   Diagnosis Date Noted  . 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 Allergic rhinitis, cause unspecified 02/27/2013   Past Medical History  Diagnosis Date  . Asthma   . Allergy    No past surgical history on file. No Known Allergies Prior to Admission medications   Medication Sig Start Date End Date Taking? Authorizing Provider  beclomethasone (QVAR) 80 MCG/ACT inhaler Inhale 2 puffs into the lungs 2 (two) times daily.    Historical Provider, MD  budesonide (PULMICORT) 180 MCG/ACT inhaler Inhale 4 puffs into the lungs 2 (two) times daily.    Historical Provider, MD  formoterol (FORADIL) 12 MCG capsule for inhaler Place 12 mcg into inhaler and inhale every 12 (twelve) hours.    Historical Provider, MD  ipratropium (ATROVENT HFA) 17 MCG/ACT inhaler Inhale 2 puffs into the lungs as needed for wheezing.    Historical Provider, MD  montelukast (SINGULAIR) 10 MG tablet Take 1 tablet (10 mg total) by mouth at bedtime. 02/27/13   Maurice March, MD  Omalizumab Geoffry Paradise [place]) Inject into  the skin. two times a month    Historical Provider, MD  tiotropium (SPIRIVA) 18 MCG inhalation capsule Place 1 capsule (18 mcg total) into inhaler and inhale daily. 02/27/13   Maurice March, MD   Social History   Social History  . Marital Status: Single    Spouse Name: N/A  . Number of Children: N/A  . Years of Education: N/A   Occupational History  . Engineer    Social History Main Topics  . Smoking status: Former Games developer  . Smokeless tobacco: Never Used  . Alcohol Use: 0.0 oz/week    1-2 Cans of beer per week  . Drug Use: No  . Sexual Activity: Yes   Other Topics Concern  . Not on file   Social History Narrative   Single. Education: [employer]. Exercise:  3 times a week for 30 minutes.   Review of Systems  Eyes: Negative for photophobia, pain, itching and visual disturbance.  Respiratory: Negative for cough and shortness of breath.   Cardiovascular: Negative for chest pain.  Skin: Positive for color change.   Objective:   Physical Exam  Constitutional: He is oriented to person, place, and time. He appears well-developed and well-nourished. No distress.  HENT:  Head: Normocephalic and atraumatic.  Eyes: Conjunctivae and EOM are normal.  He has a small chalazion, slightly erythematous lesion to  upper eye lid mid aspect on the left measuring 3-62mm. Slightly firm without surrounding erythema.  Neck: Neck supple.  Cardiovascular: Normal rate.   Pulmonary/Chest: Effort normal.  Musculoskeletal: Normal range of motion.  Neurological: He is alert and oriented to person, place, and time.  Skin: Skin is warm and dry.  Psychiatric: He has a normal mood and affect. His behavior is normal.  Nursing note and vitals reviewed.  Filed Vitals:   06/09/15 2025  BP: 130/78  Pulse: 76  Temp: 98.1 F (36.7 C)  Resp: 16  Height: 5\' 11"  (1.803 m)  Weight: 186 lb (84.369 kg)  SpO2: 98%   Assessment & Plan:  [name] is a 49 y.o. male Chalazion of left upper eyelid - Plan:  Ambulatory referral to Ophthalmology  - five-week history of left upper eyelid symptoms.   Suspected chalazion, with overall improvement just residual nodular area. Can try warm compresses over-the-counter, no sign of infection at this time, but RTC precautions discussed. We'll refer to ophthalmology for management options, but advised sometimes may take weeks to months for this to resolve on its own.  Return if worsening.  No orders of the defined types were placed in this encounter.   Patient Instructions  We will refer you to the ophthalmologist.  Okay to continue warm compresses to this area as this can sometimes help with the resolution on its own. This does not look infected now, so I do not thinks it needs an antibiotic at this point. If you notice increased swelling, redness, or spread of the redness, return so I can determine if we do need to put you on some antibiotics at that time.  Return to the clinic or go to the nearest emergency room if any of your symptoms worsen or new symptoms occur.   Chalazion A chalazion is a swelling or hard lump on the eyelid caused by a blocked oil gland. Chalazions may occur on the upper or the lower eyelid.  CAUSES  Oil gland in the eyelid becomes blocked. SYMPTOMS   Swelling or hard lump on the eyelid. This lump may make it hard to see out of the eye.  The swelling may spread to areas around the eye. TREATMENT   Although some chalazions disappear by themselves in 1 or 2 months, some chalazions may need to be removed.  Medicines to treat an infection may be required. HOME CARE INSTRUCTIONS   Wash your hands often and dry them with a clean towel. Do not touch the chalazion.  Apply heat to the eyelid several times a day for 10 minutes to help ease discomfort and bring any yellowish white fluid (pus) to the surface. One way to apply heat to a chalazion is to use the handle of a metal spoon.  Hold the handle under hot water until it is hot, and  then wrap the handle in paper towels so that the heat can come through without burning your skin.  Hold the wrapped handle against the chalazion and reheat the spoon handle as needed.  Apply heat in this fashion for 10 minutes, 4 times per day.  Return to your caregiver to have the pus removed if it does not break (rupture) on its own.  Do not try to remove the pus yourself by squeezing the chalazion or sticking it with a pin or needle.  Only take over-the-counter or prescription medicines for pain, discomfort, or fever as directed by your caregiver. SEEK IMMEDIATE MEDICAL CARE IF:   You  have pain in your eye.  Your vision changes.  The chalazion does not go away.  The chalazion becomes painful, red, or swollen, grows larger, or does not start to disappear after 2 weeks. MAKE SURE YOU:   Understand these instructions.  Will watch your condition.  Will get help right away if you are not doing well or get worse. Document Released: 10/06/2000 Document Revised: 01/01/2012 Document Reviewed: 01/24/2010 [facility] Patient Information 2015 [place], [place]. This information is not intended to replace advice given to you by your health care provider. Make sure you discuss any questions you have with your health care provider.     I personally performed the services described in this documentation, which was scribed in my presence. The recorded information has been reviewed and considered, and addended by me as needed.

## 2015-06-11 ENCOUNTER — Encounter: Payer: Self-pay | Admitting: Family Medicine

## 2015-07-05 ENCOUNTER — Ambulatory Visit: Payer: Self-pay | Admitting: Family Medicine
